# Patient Record
Sex: Female | Born: 2000 | Race: White | Hispanic: No | Marital: Single | Smoking: Never smoker
Health system: Southern US, Community
[De-identification: ages and names within clinical notes are randomized; demographics above are authoritative.]

## PROBLEM LIST (undated history)

## (undated) DIAGNOSIS — G589 Mononeuropathy, unspecified: Secondary | ICD-10-CM

---
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
## 2020-04-20 IMAGING — CT CT RENAL STONE PROTOCOL
2 of 4 series · 16 of 46 positions shown, 18 images · non-contrast
Comparison: None.

CLINICAL DATA: Right flank pain with hematuria.

EXAM:
CT ABDOMEN AND PELVIS WITHOUT CONTRAST
TECHNIQUE: Multidetector CT imaging of the abdomen and pelvis was performed
following the standard protocol without IV contrast.

[Series 4: stone study 5.0 i30f 2 · axial · 0.59mm/px · z∈[+746,+1126]mm · 13 of 84 slices shown, 15 images]
[im 4/84  soft-tissue]
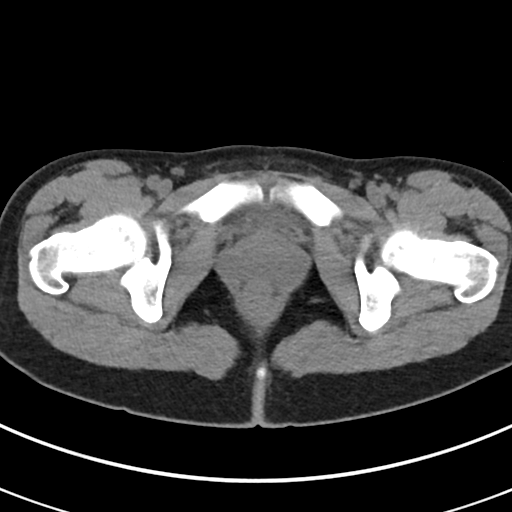
[im 4/84  bone]
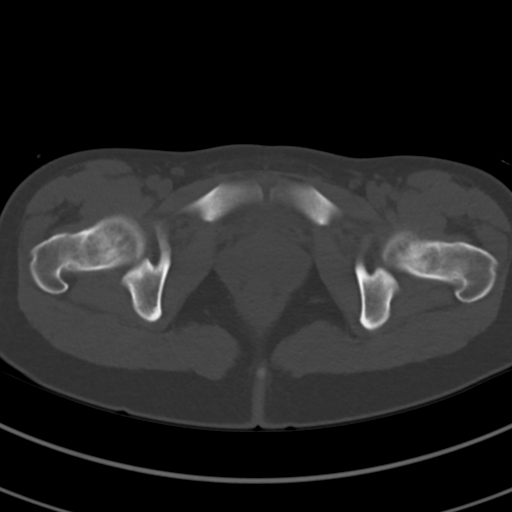
[im 11/84  soft-tissue]
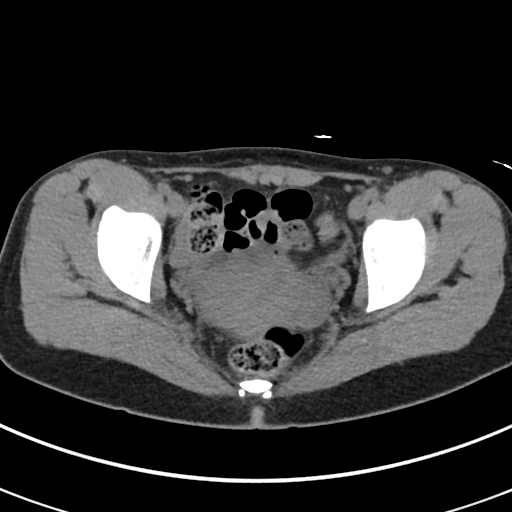
[im 19/84  soft-tissue]
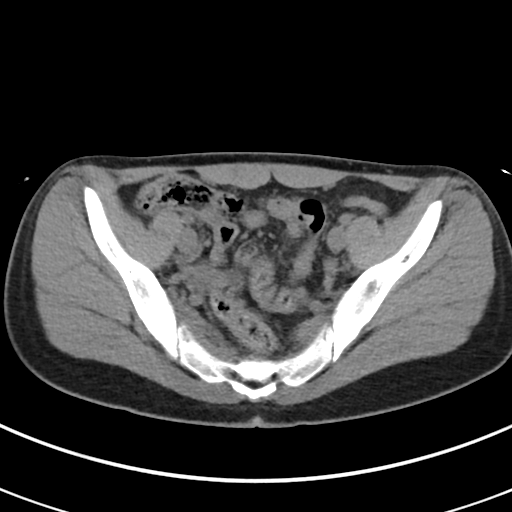
[im 22/84  soft-tissue]
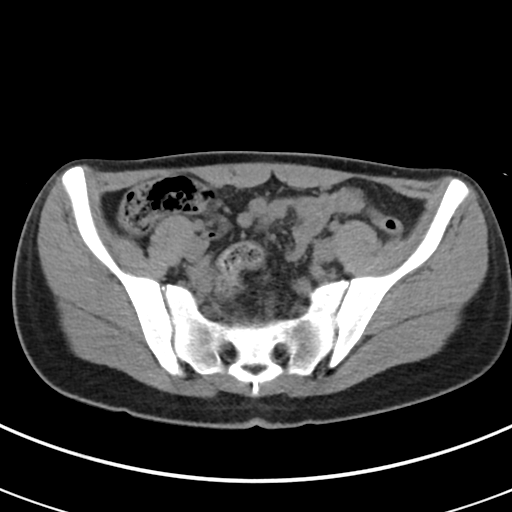
[im 29/84  soft-tissue]
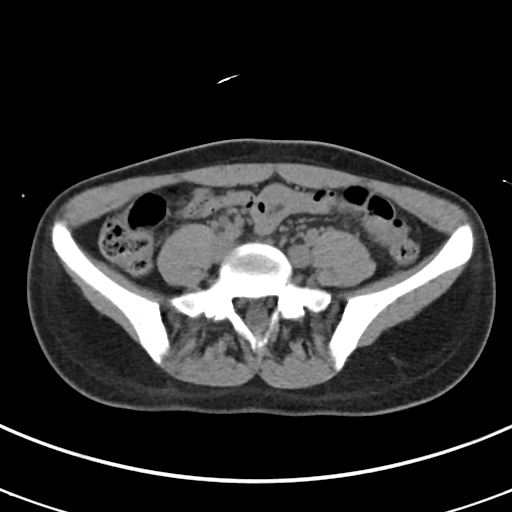
[im 37/84  soft-tissue]
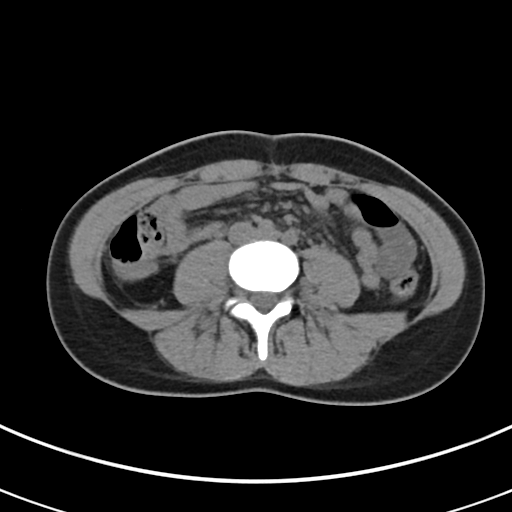
[im 44/84  soft-tissue]
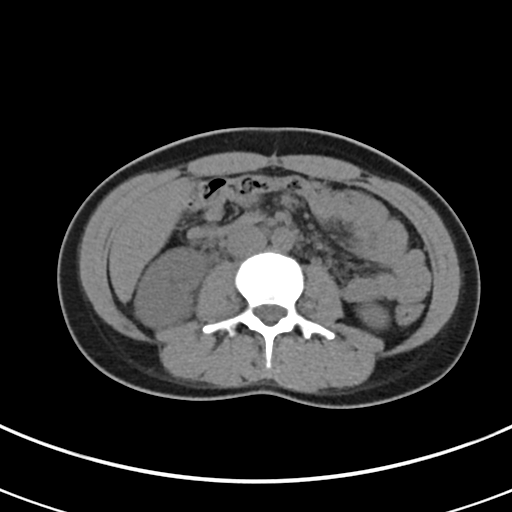
[im 47/84  soft-tissue]
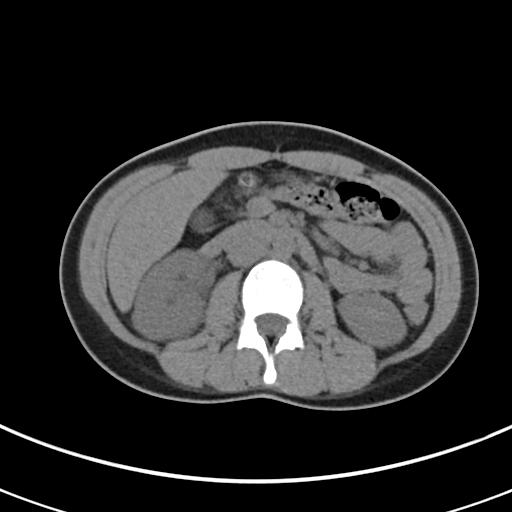
[im 55/84  soft-tissue]
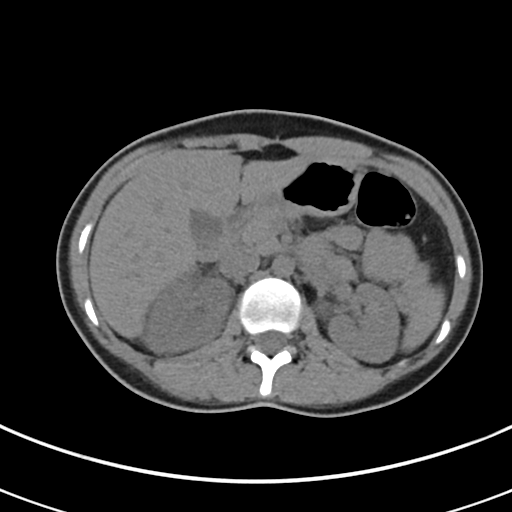
[im 55/84  bone]
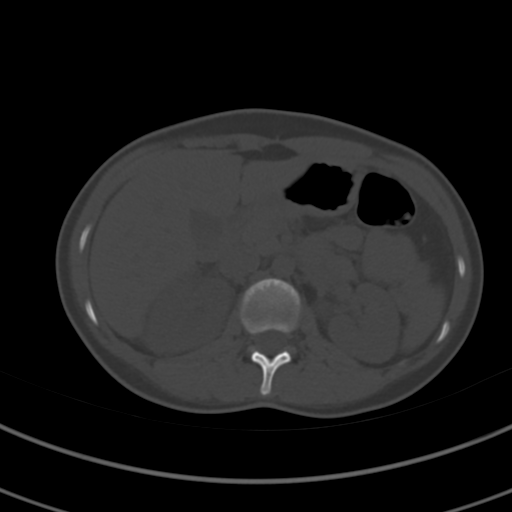
[im 62/84  soft-tissue]
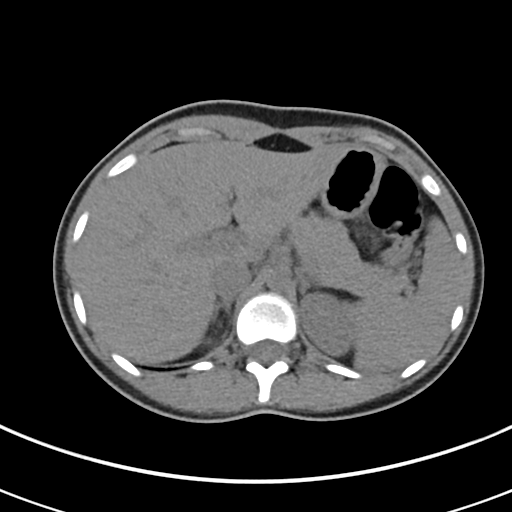
[im 65/84  soft-tissue]
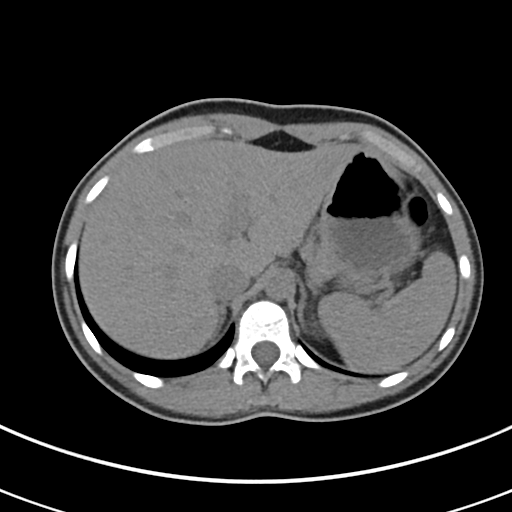
[im 73/84  soft-tissue]
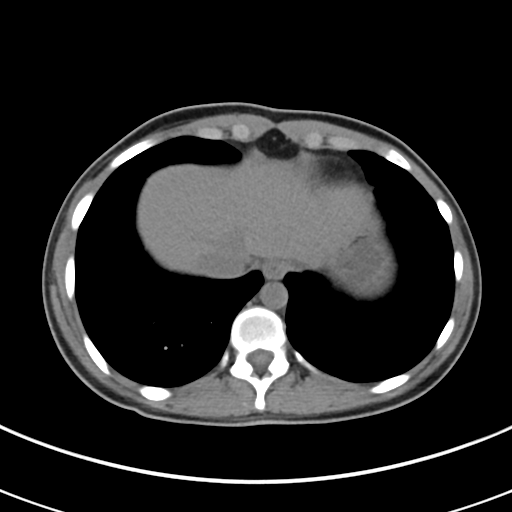
[im 80/84  soft-tissue]
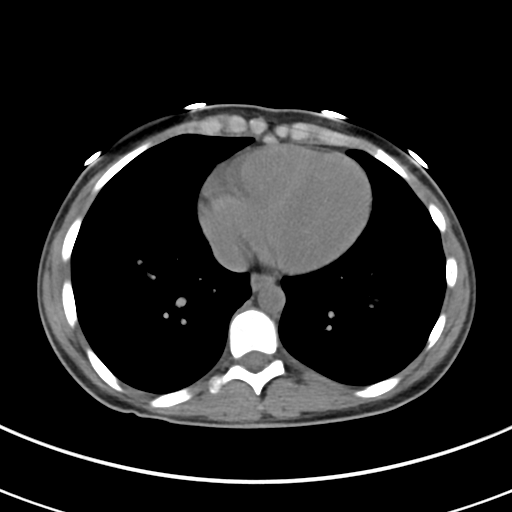

[Series 7: coronal soft tissue · coronal · 0.60mm/px · 3 of 78 slices shown]
[im 26/78  soft-tissue]
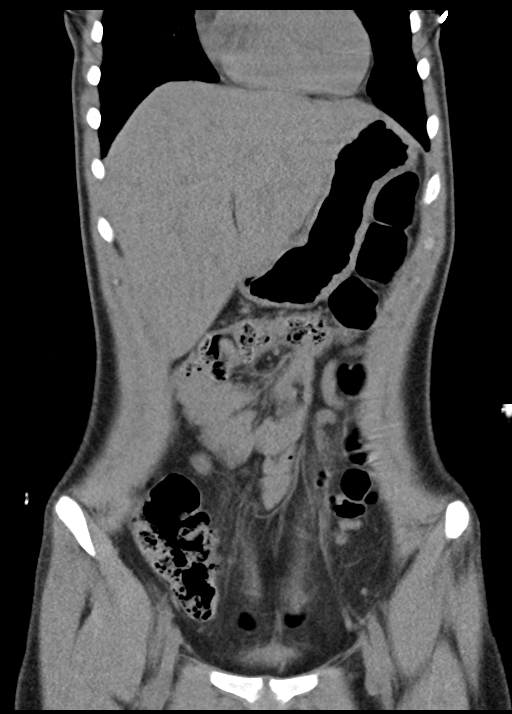
[im 35/78  soft-tissue]
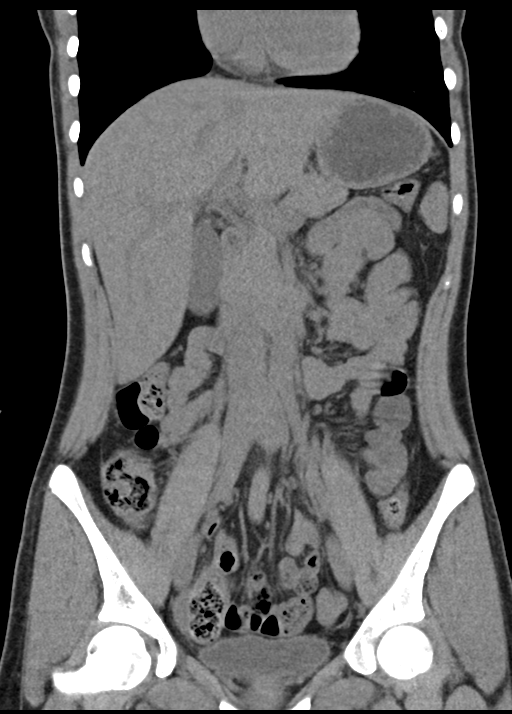
[im 43/78  soft-tissue]
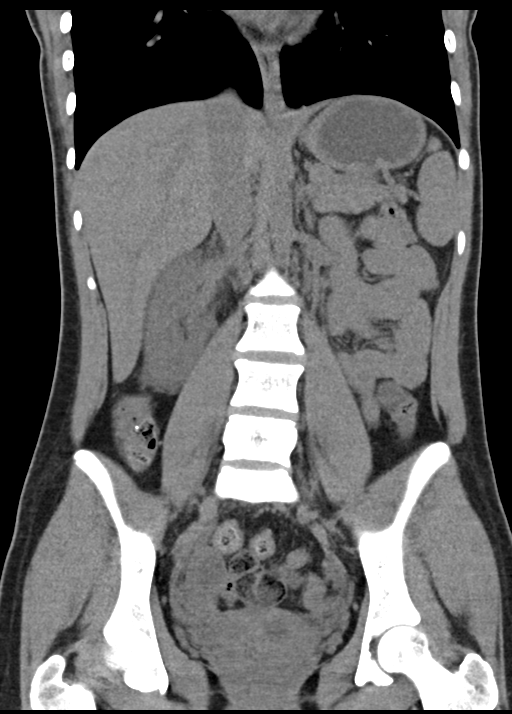

[16 of 46 positions shown; findings below may reference images not displayed]

FINDINGS: Lower chest: No acute abnormality.

Hepatobiliary: No focal liver abnormality is seen. No gallstones,
gallbladder wall thickening, or biliary dilatation.

Pancreas: Unremarkable. No pancreatic ductal dilatation or
surrounding inflammatory changes.

Spleen: Normal in size without focal abnormality.

Adrenals/Urinary Tract: Right hydronephrosis with right perinephric
stranding are identified. There is a 3 mm calcific density in the
right pelvis which may represent a distal right ureteral/ureteral
vesicular junction stone. Nonobstructing stones are identified
bilateral kidneys. The bladder is normal.

Stomach/Bowel: Stomach is within normal limits. The appendix is not
seen but no inflammation is noted around cecum. No evidence of bowel
wall thickening, distention, or inflammatory changes.

Vascular/Lymphatic: No significant vascular findings are present. No
enlarged abdominal or pelvic lymph nodes.

Reproductive: Uterus and bilateral adnexa are unremarkable.

Other: None.

Musculoskeletal: No acute or significant osseous findings.
IMPRESSION: Right hydroureteronephrosis with right perinephric stranding. There
is a 3 mm calcific density in the right pelvis which may represent a
distal right ureteral/ureteral vesicular junction stone.

Small nonobstructing stones in both kidneys.

## 2021-01-30 ENCOUNTER — Other Ambulatory Visit: Payer: Self-pay | Admitting: Urology

## 2022-08-13 ENCOUNTER — Other Ambulatory Visit: Payer: Self-pay | Admitting: Urology

## 2022-08-16 ENCOUNTER — Other Ambulatory Visit: Payer: Self-pay

## 2022-08-16 MED ORDER — POTASSIUM CITRATE ER 15 MEQ (1620 MG) PO TBCR: 1.0000 | EXTENDED_RELEASE_TABLET | Freq: Two times a day (BID) | ORAL | 3 refills | Status: AC

## 2023-09-21 ENCOUNTER — Other Ambulatory Visit: Payer: Self-pay | Admitting: Urology

## 2024-06-13 ENCOUNTER — Other Ambulatory Visit: Payer: Self-pay | Admitting: Gynecology

## 2024-06-13 DIAGNOSIS — N6011 Diffuse cystic mastopathy of right breast: Secondary | ICD-10-CM

## 2024-06-20 ENCOUNTER — Ambulatory Visit
Admission: RE | Admit: 2024-06-20 | Discharge: 2024-06-20 | Disposition: A | Source: Ambulatory Visit | Attending: Gynecology | Admitting: Gynecology

## 2024-06-20 DIAGNOSIS — N6011 Diffuse cystic mastopathy of right breast: Secondary | ICD-10-CM

## 2024-06-21 ENCOUNTER — Other Ambulatory Visit: Payer: Self-pay | Admitting: Gynecology

## 2024-06-21 DIAGNOSIS — R928 Other abnormal and inconclusive findings on diagnostic imaging of breast: Secondary | ICD-10-CM

## 2024-12-19 ENCOUNTER — Other Ambulatory Visit
